# Patient Record
Sex: Female | Born: 1945 | Race: Black or African American | Hispanic: No | Smoking: Former smoker
Health system: Southern US, Community
[De-identification: ages and names within clinical notes are randomized; demographics above are authoritative.]

## PROBLEM LIST (undated history)

## (undated) DIAGNOSIS — H9190 Unspecified hearing loss, unspecified ear: Secondary | ICD-10-CM

## (undated) DIAGNOSIS — G930 Cerebral cysts: Secondary | ICD-10-CM

## (undated) DIAGNOSIS — J449 Chronic obstructive pulmonary disease, unspecified: Secondary | ICD-10-CM

## (undated) DIAGNOSIS — I10 Essential (primary) hypertension: Secondary | ICD-10-CM

## (undated) HISTORY — DX: Essential (primary) hypertension: I10

## (undated) HISTORY — DX: Unspecified hearing loss, unspecified ear: H91.90

## (undated) HISTORY — DX: Cerebral cysts: G93.0

## (undated) HISTORY — DX: Chronic obstructive pulmonary disease, unspecified: J44.9

---

## 2004-10-15 DIAGNOSIS — E7800 Pure hypercholesterolemia, unspecified: Secondary | ICD-10-CM | POA: Insufficient documentation

## 2010-11-18 DIAGNOSIS — J449 Chronic obstructive pulmonary disease, unspecified: Secondary | ICD-10-CM | POA: Insufficient documentation

## 2011-01-18 DIAGNOSIS — H9190 Unspecified hearing loss, unspecified ear: Secondary | ICD-10-CM | POA: Insufficient documentation

## 2012-08-29 DIAGNOSIS — G930 Cerebral cysts: Secondary | ICD-10-CM

## 2012-08-29 HISTORY — DX: Cerebral cysts: G93.0

## 2012-12-06 DIAGNOSIS — R42 Dizziness and giddiness: Secondary | ICD-10-CM | POA: Insufficient documentation

## 2013-01-04 ENCOUNTER — Ambulatory Visit: Payer: Self-pay | Admitting: Otolaryngology

## 2013-01-04 LAB — CREATININE, SERUM
EGFR (African American): 42 — ABNORMAL LOW
EGFR (Non-African Amer.): 37 — ABNORMAL LOW

## 2013-01-18 ENCOUNTER — Ambulatory Visit: Payer: Self-pay | Admitting: Otolaryngology

## 2013-02-15 DIAGNOSIS — G930 Cerebral cysts: Secondary | ICD-10-CM | POA: Insufficient documentation

## 2013-02-27 DIAGNOSIS — G450 Vertebro-basilar artery syndrome: Secondary | ICD-10-CM | POA: Insufficient documentation

## 2013-07-30 DIAGNOSIS — R269 Unspecified abnormalities of gait and mobility: Secondary | ICD-10-CM | POA: Insufficient documentation

## 2013-12-24 DIAGNOSIS — L299 Pruritus, unspecified: Secondary | ICD-10-CM | POA: Insufficient documentation

## 2014-09-17 DIAGNOSIS — M1A09X0 Idiopathic chronic gout, multiple sites, without tophus (tophi): Secondary | ICD-10-CM | POA: Insufficient documentation

## 2017-02-06 DIAGNOSIS — I63511 Cerebral infarction due to unspecified occlusion or stenosis of right middle cerebral artery: Secondary | ICD-10-CM | POA: Insufficient documentation

## 2017-02-24 DIAGNOSIS — N182 Chronic kidney disease, stage 2 (mild): Secondary | ICD-10-CM | POA: Insufficient documentation

## 2017-07-04 ENCOUNTER — Other Ambulatory Visit: Payer: Self-pay | Admitting: Otolaryngology

## 2017-07-04 DIAGNOSIS — R1310 Dysphagia, unspecified: Secondary | ICD-10-CM

## 2017-07-17 ENCOUNTER — Ambulatory Visit
Admission: RE | Admit: 2017-07-17 | Discharge: 2017-07-17 | Disposition: A | Discharge status: Home / Self Care | Payer: Medicare Other | Source: Ambulatory Visit | Attending: Otolaryngology | Admitting: Otolaryngology

## 2017-07-17 ENCOUNTER — Encounter: Payer: Self-pay | Admitting: Radiology

## 2017-07-17 DIAGNOSIS — K224 Dyskinesia of esophagus: Secondary | ICD-10-CM | POA: Insufficient documentation

## 2017-07-17 DIAGNOSIS — R1310 Dysphagia, unspecified: Secondary | ICD-10-CM | POA: Diagnosis not present

## 2017-12-06 ENCOUNTER — Ambulatory Visit (INDEPENDENT_AMBULATORY_CARE_PROVIDER_SITE_OTHER): Payer: Medicare Other | Admitting: Psychiatry

## 2017-12-06 ENCOUNTER — Encounter: Payer: Self-pay | Admitting: Psychiatry

## 2017-12-06 ENCOUNTER — Other Ambulatory Visit: Payer: Self-pay

## 2017-12-06 VITALS — BP 135/70 | HR 73 | Temp 98.6°F | Ht 64.57 in | Wt 215.6 lb

## 2017-12-06 DIAGNOSIS — F29 Unspecified psychosis not due to a substance or known physiological condition: Secondary | ICD-10-CM

## 2017-12-06 MED ORDER — OLANZAPINE 2.5 MG PO TABS: 5.0000 mg | ORAL_TABLET | Freq: Every day | ORAL | 2 refills | Status: AC

## 2017-12-06 NOTE — Progress Notes (Deleted)
BH MD/PA/NP OP Progress Note  12/06/2017 10:17 AM Molly Price  MRN:  [id number]  Chief Complaint:  Chief Complaint    Establish Care; Hallucinations; Anxiety     HPI: *** Visit Diagnosis: No diagnosis found.  Past Psychiatric History: ***  Past Medical History: History reviewed. No pertinent past medical history. History reviewed. No pertinent surgical history.  Family Psychiatric History: ***  Family History:  Family History  Problem Relation Age of Onset  . Alcohol abuse Father     Social History:  Social History   Socioeconomic History  . Marital status: Widowed    Spouse name: Not on file  . Number of children: 3  . Years of education: Not on file  . Highest education level: 10th grade  Occupational History    Comment: retired  Engineer, production  . Financial resource strain: Not hard at all  . Food insecurity:    Worry: Never true    Inability: Never true  . Transportation needs:    Medical: No    Non-medical: No  Tobacco Use  . Smoking status: Former Smoker    Types: Cigarettes    Last attempt to quit: 2005    Years since quitting: 14.2  . Smokeless tobacco: Never Used  Substance and Sexual Activity  . Alcohol use: Not Currently  . Drug use: Not Currently  . Sexual activity: Not Currently  Lifestyle  . Physical activity:    Days per week: 0 days    Minutes per session: 0 min  . Stress: Not at all  Relationships  . Social connections:    Talks on phone: More than three times a week    Gets together: Three times a week    Attends religious service: More than 4 times per year    Active member of club or organization: No    Attends meetings of clubs or organizations: Never    Relationship status: Widowed  Other Topics Concern  . Not on file  Social History Narrative  . Not on file    Allergies:  Allergies  Allergen Reactions  . Other Other (See Comments)    Caused numbness and tingling    Metabolic Disorder Labs: No results found for:  HGBA1C, MPG No results found for: PROLACTIN No results found for: CHOL, TRIG, HDL, CHOLHDL, VLDL, LDLCALC No results found for: TSH  Therapeutic Level Labs: No results found for: LITHIUM No results found for: VALPROATE No components found for:  CBMZ  Current Medications: Current Outpatient Medications  Medication Sig Dispense Refill  . amLODipine (NORVASC) 10 MG tablet Take by mouth.    . docusate sodium (COLACE) 100 MG capsule TAKE 1 CAPSULE (100 MG TOTAL) BY MOUTH TWO (2) TIMES A DAY.  0  . Fluticasone-Salmeterol (ADVAIR DISKUS) 250-50 MCG/DOSE AEPB Inhale into the lungs.    . lovastatin (MEVACOR) 40 MG tablet Take by mouth.    . OLANZapine (ZYPREXA) 2.5 MG tablet TAKE 1 TABLET (2.5 MG TOTAL) BY MOUTH NIGHTLY.  2  . polyethylene glycol (MIRALAX / GLYCOLAX) packet Take by mouth.    . ranitidine (ZANTAC 75) 75 MG tablet Take by mouth.    . triamterene-hydrochlorothiazide (MAXZIDE) 75-50 MG tablet Take by mouth.     No current facility-administered medications for this visit.      Musculoskeletal: Strength & Muscle Tone: {desc; muscle tone:32375} Gait & Station: {PE GAIT ED NATL:22525} Patient leans: {Patient Leans:21022755}  Psychiatric Specialty Exam: ROS  Blood pressure 135/70, pulse 73, temperature 98.6 F (37  C), temperature source Oral, height 5' 4.57" (1.64 m), weight 215 lb 9.6 oz (97.8 kg).Body mass index is 36.36 kg/m.  General Appearance: {Appearance:22683}  Eye Contact:  {BHH EYE CONTACT:22684}  Speech:  {Speech:22685}  Volume:  {Volume (PAA):22686}  Mood:  {BHH MOOD:22306}  Affect:  {Affect (PAA):22687}  Thought Process:  {Thought Process (PAA):22688}  Orientation:  {BHH ORIENTATION (PAA):22689}  Thought Content: {Thought Content:22690}   Suicidal Thoughts:  {ST/HT (PAA):22692}  Homicidal Thoughts:  {ST/HT (PAA):22692}  Memory:  {BHH MEMORY:22881}  Judgement:  {Judgement (PAA):22694}  Insight:  {Insight (PAA):22695}  Psychomotor Activity:  {Psychomotor  (PAA):22696}  Concentration:  {Concentration:21399}  Recall:  {BHH GOOD/FAIR/POOR:22877}  Fund of Knowledge: {BHH GOOD/FAIR/POOR:22877}  Language: {BHH GOOD/FAIR/POOR:22877}  Akathisia:  {BHH YES OR NO:22294}  Handed:  {Handed:22697}  AIMS (if indicated): {Desc; done/not:10129}  Assets:  {Assets (PAA):22698}  ADL's:  {BHH ZOX'W:96045}ADL'S:22290}  Cognition: {chl bhh cognition:304700322}  Sleep:  {BHH GOOD/FAIR/POOR:22877}   Screenings:   Assessment and Plan: ***   Jomarie LongsSaramma Addilee Neu, MD 12/06/2017, 10:17 AM

## 2017-12-06 NOTE — Progress Notes (Signed)
Psychiatric Initial Adult Assessment   Patient Identification: Molly Price MRN:  [id number] Date of Evaluation:  12/06/2017 Referral Source: Dr.Mark Heffington  Chief Complaint:  ' I am seeing things.' Chief Complaint    Establish Care; Hallucinations; Anxiety     Visit Diagnosis:    ICD-10-CM   1. Psychosis, unspecified psychosis type (HCC) F29     History of Present Illness:  Molly Price is a 72 year old African-American female, widowed,lives in [place], has a history of psychosis, arachnoid cyst, bilateral sensorineural hearing loss, hypertension, COPD, surgery to the clinic today referred by her PMD.  Olisa appeared to be a poor historian and also had difficulty participating in the evaluation due to her bilateral hearing loss.  Hence patient's daughter Molly Price assisted in providing information.  Alyiah reports her mood as fair.  She denies any depressive symptoms.  She was able to fill out a PHQ 9.  She described some trouble falling asleep and staying asleep and also having some issues with her energy level.  She otherwise denies any mood symptoms.  Patient denies any significant anxiety symptoms other than situational.  Patient denies any panic attacks.  Patient does report visual hallucinations.  Patient reports she sees people who are dressed like people but they are other beings.  Patient reports this has been ongoing since the past several years.  Patient reports this may have started in December 2017.  Patient reports she is currently on olanzapine 5 mg at bedtime.  Patient reports the olanzapine is helpful.  Per daughter Molly Price patient also has some delusions.  She talks to imaginary people.  She also has named one of the persons as Scientific laboratory technician. Per patient's daughter ever since her doctor prescribed her the olanzapine her symptoms have improved.  Daughter describes a history of an arachnoid cyst which she states was diagnosed in 2014.  Patient had neurology visits at that time.   Patient also had a neurology appointment recently on April 5 th for a recent fall .  At that time was evaluated for any neurological problems.  She does continue to have some imbalance while walking.  Unknown if this is due to her cyst.  Per neurology evaluation patient does not have any significant cognitive issues or dementia at this time.   Writer also did an MMSE on patient.  Patient scored 28 out of 30.  Patient denies any history of substance abuse problems.  Patient denies any history of trauma.  Denies any history of mental health issues in the past.  Patient denies any family history of mental health problems.  Associated Signs/Symptoms: Depression Symptoms:  anxiety, (Hypo) Manic Symptoms:  denies Anxiety Symptoms:  unspecified anxiety sx Psychotic Symptoms:  Delusions, Hallucinations: Visual PTSD Symptoms: Negative  Past Psychiatric History: She was recently started on Zyprexa by her primary medical doctor for psychosis.  Denies any previous history of mental health problems, inpatient admissions or treatments for the same.  Patient denies any suicidality  Previous Psychotropic Medications: Yes - zyprexa  Substance Abuse History in the last 12 months:  No.  Consequences of Substance Abuse: Negative  Past Medical History:  Past Medical History:  Diagnosis Date  . Arachnoid cyst 2014  . COPD (chronic obstructive pulmonary disease) (HCC)   . Hearing loss   . Hypertension    History reviewed. No pertinent surgical history.  Family Psychiatric History: Denies any history of mental health problems in her family.  Family History:  Family History  Problem Relation Age of Onset  . Alcohol  abuse Father     Social History:   Social History   Socioeconomic History  . Marital status: Widowed    Spouse name: Not on file  . Number of children: 3  . Years of education: Not on file  . Highest education level: 10th grade  Occupational History    Comment: retired  Sports coach  . Financial resource strain: Not hard at all  . Food insecurity:    Worry: Never true    Inability: Never true  . Transportation needs:    Medical: No    Non-medical: No  Tobacco Use  . Smoking status: Former Smoker    Types: Cigarettes    Last attempt to quit: 2005    Years since quitting: 14.2  . Smokeless tobacco: Never Used  Substance and Sexual Activity  . Alcohol use: Not Currently  . Drug use: Not Currently  . Sexual activity: Not Currently  Lifestyle  . Physical activity:    Days per week: 0 days    Minutes per session: 0 min  . Stress: Not at all  Relationships  . Social connections:    Talks on phone: More than three times a week    Gets together: Three times a week    Attends religious service: More than 4 times per year    Active member of club or organization: No    Attends meetings of clubs or organizations: Never    Relationship status: Widowed  Other Topics Concern  . Not on file  Social History Narrative  . Not on file    Additional Social History: She is widowed.  Patient is retired.  Patient has 2 daughters and 1 son.  Her children are very supportive.  Patient lives in an [place] by herself.  Her son lives right across the house.  Allergies:   Allergies  Allergen Reactions  . Maalox [Calcium Carbonate Antacid] Hives  . Other Other (See Comments)    Caused numbness and tingling    Metabolic Disorder Labs: No results found for: HGBA1C, MPG No results found for: PROLACTIN No results found for: CHOL, TRIG, HDL, CHOLHDL, VLDL, LDLCALC   Current Medications: Current Outpatient Medications  Medication Sig Dispense Refill  . amLODipine (NORVASC) 10 MG tablet Take by mouth.    . docusate sodium (COLACE) 100 MG capsule TAKE 1 CAPSULE (100 MG TOTAL) BY MOUTH TWO (2) TIMES A DAY.  0  . Fluticasone-Salmeterol (ADVAIR DISKUS) 250-50 MCG/DOSE AEPB Inhale into the lungs.    . lovastatin (MEVACOR) 40 MG tablet Take by mouth.    . OLANZapine  (ZYPREXA) 2.5 MG tablet Take 2 tablets (5 mg total) by mouth at bedtime. Patient has supplies 60 tablet 2  . polyethylene glycol (MIRALAX / GLYCOLAX) packet Take by mouth.    . ranitidine (ZANTAC 75) 75 MG tablet Take by mouth.    . triamterene-hydrochlorothiazide (MAXZIDE) 75-50 MG tablet Take by mouth.     No current facility-administered medications for this visit.     Neurologic: Headache: No Seizure: No Paresthesias:No  Musculoskeletal: Strength & Muscle Tone: within normal limits Gait & Station: uses a cane Patient leans: N/A  Psychiatric Specialty Exam: Review of Systems  Psychiatric/Behavioral: The patient is nervous/anxious (situational).   All other systems reviewed and are negative.   Blood pressure 135/70, pulse 73, temperature 98.6 F (37 C), temperature source Oral, height 5' 4.57" (1.64 m), weight 215 lb 9.6 oz (97.8 kg).Body mass index is 36.36 kg/m.  General Appearance: Casual  Eye Contact:  Fair  Speech:  Clear and Coherent  Volume:  Normal  Mood:  Euthymic  Affect:  Congruent  Thought Process:  Goal Directed and Descriptions of Associations: Intact  Orientation:  Full (Time, Place, and Person)  Thought Content:  Delusions  Suicidal Thoughts:  No  Homicidal Thoughts:  No  Memory:  Immediate;   Fair Recent;   Fair Remote;   Fair  Judgement:  Fair  Insight:  Fair  Psychomotor Activity:  Normal  Concentration:  Concentration: Fair and Attention Span: Fair  Recall:  [employer]Fair  Fund of Knowledge:Fair  Language: Fair  Akathisia:  No  Handed:  Right  AIMS (if indicated):  0  Assets:  Communication Skills Desire for Improvement Housing Social Support  ADL's:  Intact  Cognition: WNL  Sleep:  Fair on medications    Treatment Plan Summary:Vandana a 72 year old African-American female, widowed,[place] , history of psychosis, arachnoid cyst, hypertension, COPD, bilateral hearing loss, presented to the clinic today referred by her PMD.  Patient reports  her psychosis is currently under control on Zyprexa.  Patient also is being followed by neurology due to her arachnoid cyst as well as a recent fall.  Patient denies any significant mood symptoms.  Patient also denies any history of memory issues.  Patient denies any family history of mental health issues.  Patient denies abusing any drugs or alcohol.  Patient denies any suicidality and has good social support from her children.  Patient had an evaluation of her memory done-MMSE and she did well on the test.  Unknown what could be contributing to her psychosis at this time.  Discussed with patient as well as daughter to continue the Zyprexa as scheduled.  Patient does have a history of balance problems as well as an arachnoid cyst.  Patient is being seen by neurology who can rule out other problems like Lewy body dementia and so on.  Discussed with patient to continue the Zyprexa which has been effective.  Plan as noted below. Medication management and Plan as noted below Plan  Psychosis, unspecified Patient recommended to closely follow-up with PMD as well as neurology. Patient had MMSE done today-28 out of 30.  Patient denies any significant memory issues at this time and is able to function with support from her children. However patient can be  closely followed up and monitored. Discussed to continue Zyprexa 5 mg at bedtime. Provided medication education as well as the effect of Zyprexa like dizziness, orthostatic hypotension.   Will request the following labs-vitamin B12, folate, vitamin D, RPR.  Patient had TSH done recently which was within normal limits.  Follow-up in clinic as needed.  More than 50 % of the time was spent for psychoeducation and supportive psychotherapy and care coordination.  This note was generated in part or whole with voice recognition software. Voice recognition is usually quite accurate but there are transcription errors that can and very often do occur. I apologize  for any typographical errors that were not detected and corrected.      Jomarie LongsSaramma Haelee Bolen, MD 4/10/20192:29 PM

## 2018-10-28 DEATH — deceased

## 2019-03-04 IMAGING — RF DG ESOPHAGUS
9 series · 13 of 21 positions shown · non-contrast
Comparison: None.

CLINICAL DATA: Dysphagia.

EXAM:
ESOPHOGRAM / BARIUM SWALLOW / BARIUM TABLET STUDY
TECHNIQUE: Combined double contrast and single contrast examination performed
using effervescent crystals, thick barium liquid, and thin barium
liquid. The patient was observed with fluoroscopy swallowing a 13 mm
barium sulphate tablet.
FLUOROSCOPY TIME:  Fluoroscopy Time:  1 minute 36 seconds

[Series 1: cp_standard · 0.64mm/px · 2 of 96 frames shown (1 of 3)]
[frame 15/96]
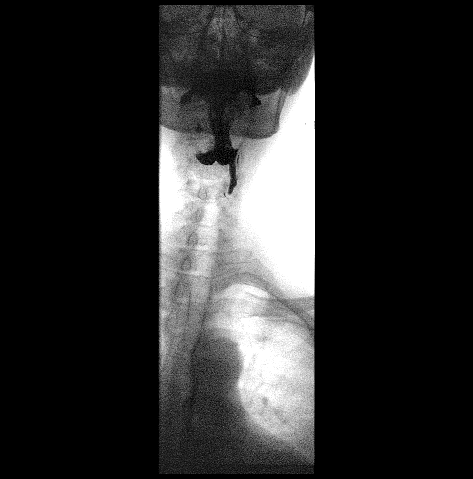
[frame 49/96]
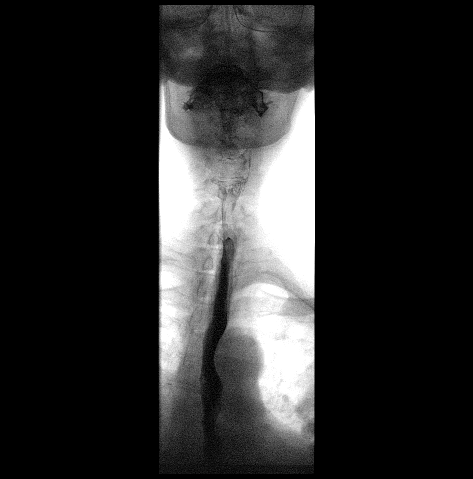

[Series 2: cp_standard · 0.64mm/px · 3 of 79 frames shown (2 of 3)]
[frame 12/79]
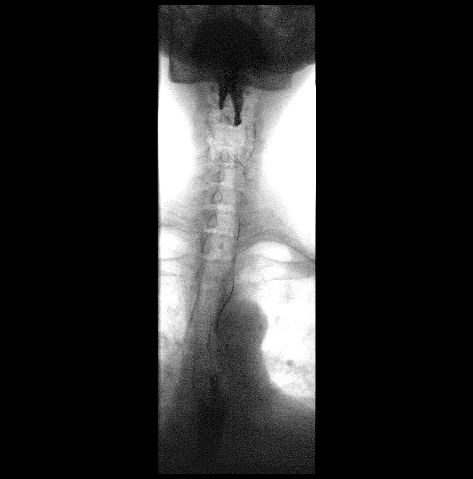
[frame 33/79]
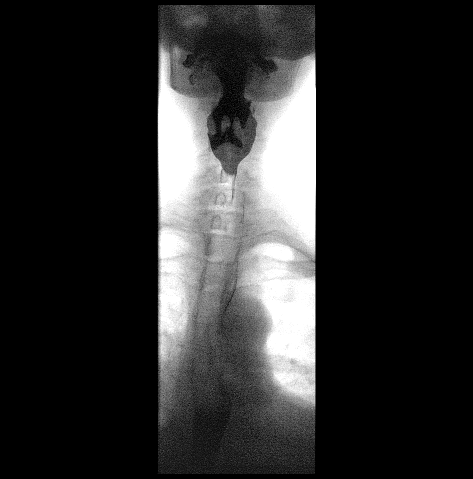
[frame 68/79]
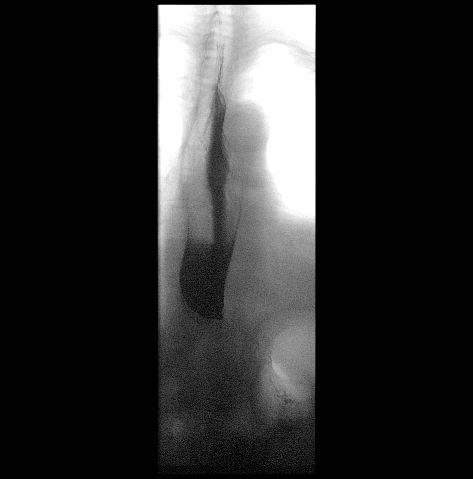

[Series 3: cp_standard · 0.32mm/px · 1 of 1 slices shown (3 of 3)]
[im 1/1]
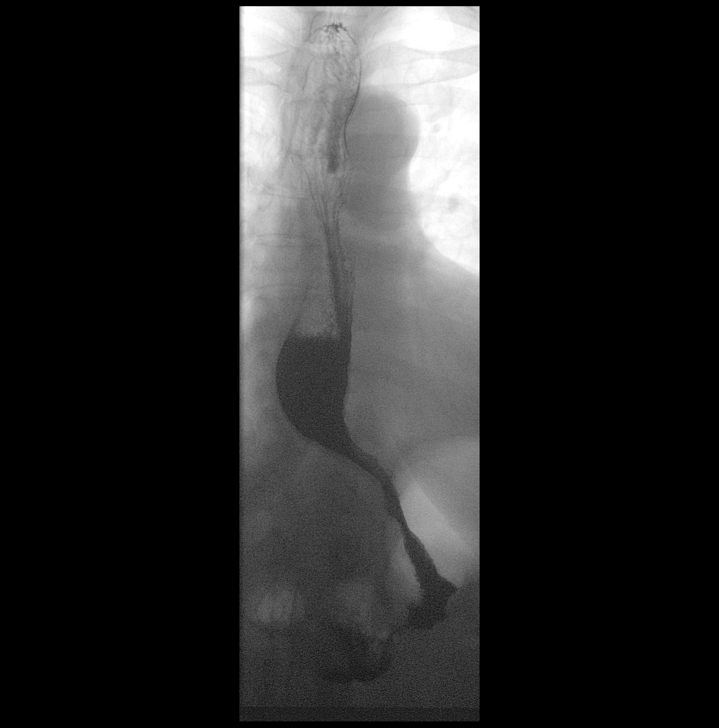

[Series 4: fluoro_barium 2fps_bw · 0.20mm/px · 1 of 2 frames shown (1 of 6)]
[frame 2/2]
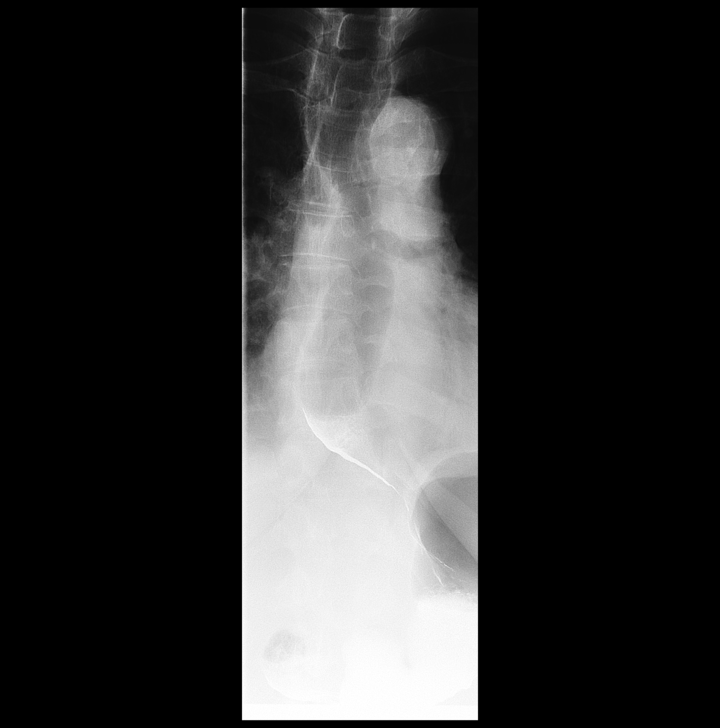

[Series 5: fluoro_barium 2fps_bw · 0.19mm/px · 1 of 2 frames shown (2 of 6)]
[frame 2/2]
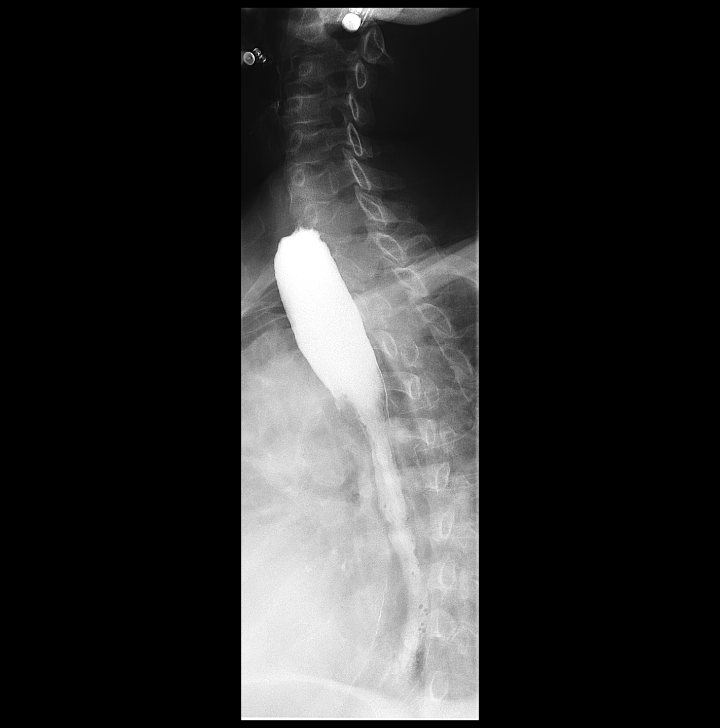

[Series 6: fluoro_barium 2fps_bw · 0.19mm/px · 1 of 2 frames shown (3 of 6)]
[frame 1/2]
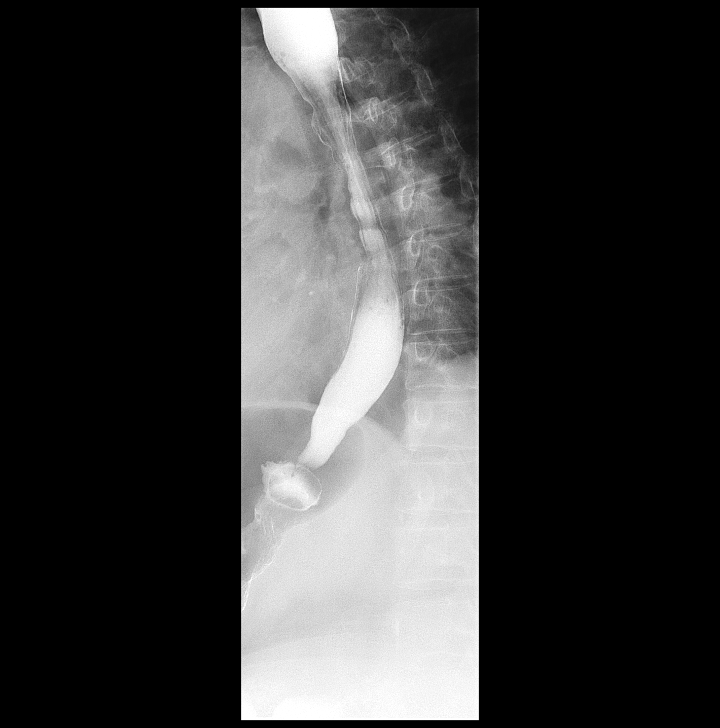

[Series 7: fluoro_barium 2fps_bw · 0.19mm/px · 2 of 2 frames shown (4 of 6)]
[frame 1/2]
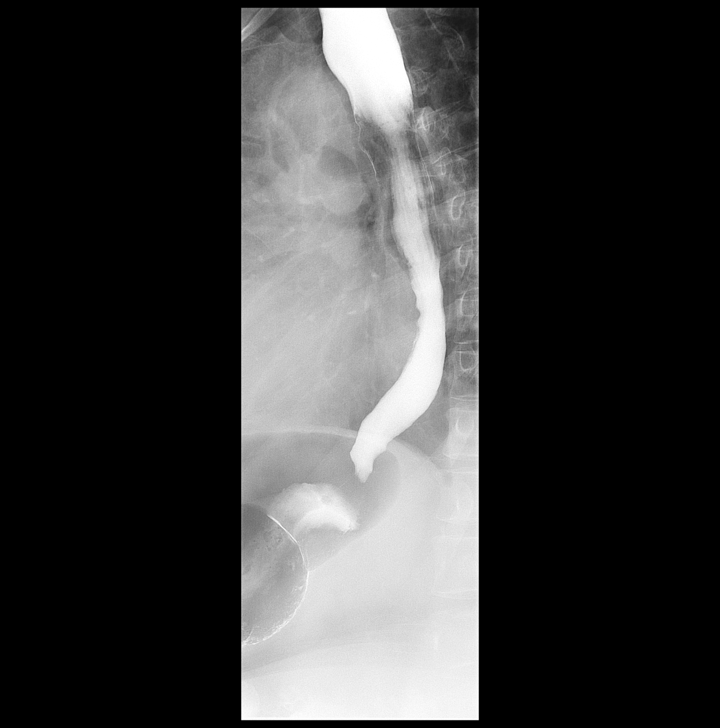
[frame 2/2]
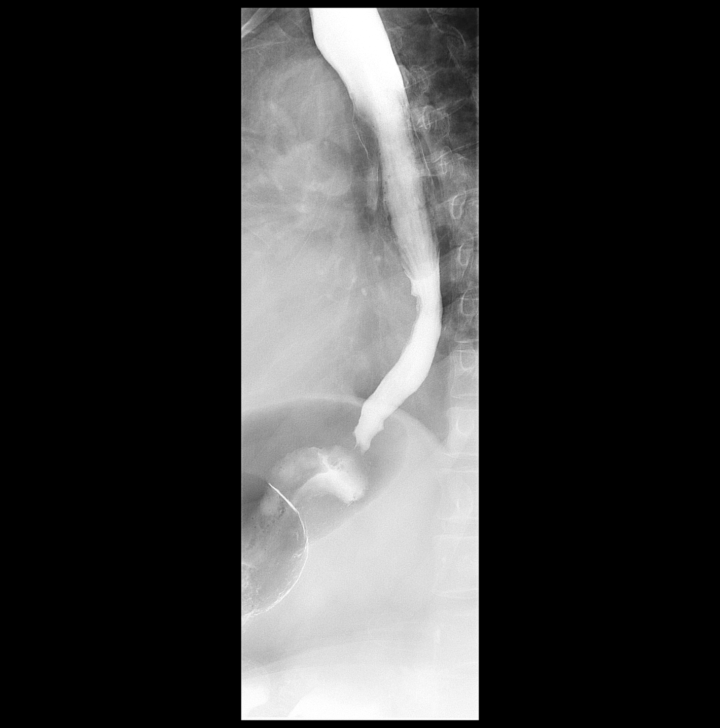

[Series 8: fluoro_barium 2fps_bw · 0.19mm/px · 1 of 2 frames shown (5 of 6)]
[frame 2/2]
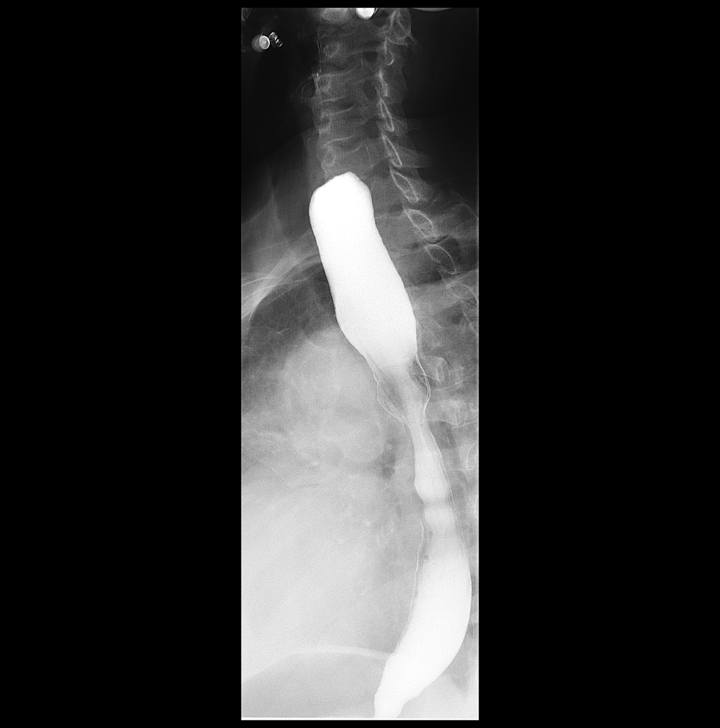

[Series 9: fluoro_barium 2fps_bw · 0.19mm/px · 1 of 2 frames shown (6 of 6)]
[frame 2/2]
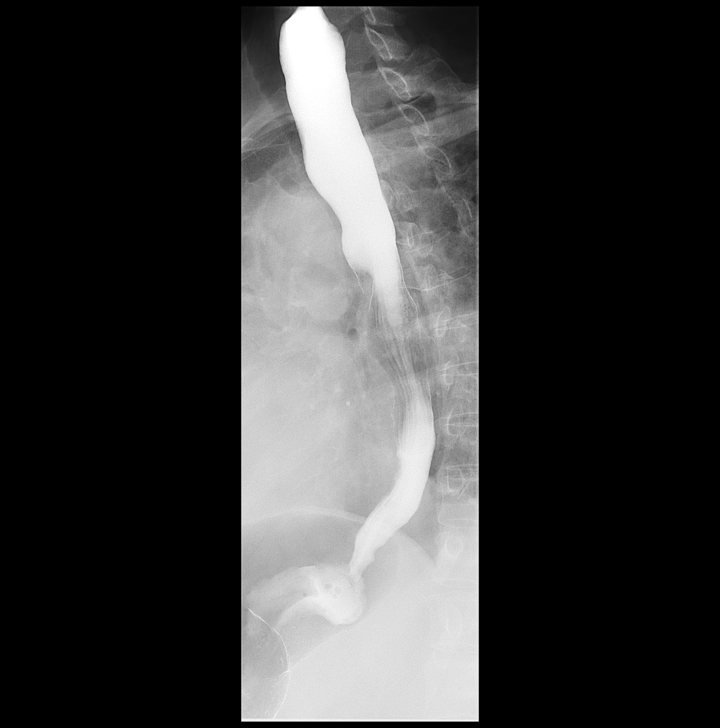

[13 of 21 positions shown; findings below may reference images not displayed]

FINDINGS: The patient had premature spill of contrast into the valleculae and
piriform sinuses but there was no aspiration. The oropharyngeal
swallowing mechanisms are otherwise normal.

The mucosa of the esophagus appears normal with no hiatal hernia or
esophagitis or spontaneous reflux.

The patient does have an esophageal motility disorder with a poor
secondary stripping wave and occasional tertiary contractions in the
midesophagus.

A 13 mm barium tablet passed immediately from the mouth to the
stomach with no delay. The patient reported a sensation that the
pill was in the cervical region when the the pill was actually in
the stomach.
IMPRESSION: 1. Premature spill of contrast into the valleculae and piriform
sinuses. No aspiration.
2. Esophageal motility disorder with a poor secondary stripping wave
and occasional tertiary contractions.
3. No stricture or esophagitis or hiatal hernia.
